# Patient Record
Sex: Male | Born: 1990 | Race: Black or African American | Hispanic: No | Marital: Single | Smoking: Current every day smoker
Health system: Southern US, Community
[De-identification: ages and names within clinical notes are randomized; demographics above are authoritative.]

## PROBLEM LIST (undated history)

## (undated) DIAGNOSIS — S82843A Displaced bimalleolar fracture of unspecified lower leg, initial encounter for closed fracture: Secondary | ICD-10-CM

## (undated) DIAGNOSIS — Z789 Other specified health status: Secondary | ICD-10-CM

## (undated) HISTORY — PX: NO PAST SURGERIES: SHX2092

---
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
## 2015-05-31 ENCOUNTER — Encounter (HOSPITAL_COMMUNITY): Payer: Self-pay | Admitting: *Deleted

## 2015-05-31 ENCOUNTER — Emergency Department (HOSPITAL_COMMUNITY)
Admission: EM | Admit: 2015-05-31 | Discharge: 2015-05-31 | Disposition: A | Discharge status: Home / Self Care | Payer: BLUE CROSS/BLUE SHIELD | Attending: Emergency Medicine | Admitting: Emergency Medicine

## 2015-05-31 DIAGNOSIS — F17210 Nicotine dependence, cigarettes, uncomplicated: Secondary | ICD-10-CM | POA: Diagnosis not present

## 2015-05-31 DIAGNOSIS — J3489 Other specified disorders of nose and nasal sinuses: Secondary | ICD-10-CM | POA: Diagnosis present

## 2015-05-31 DIAGNOSIS — J020 Streptococcal pharyngitis: Secondary | ICD-10-CM | POA: Diagnosis not present

## 2015-05-31 DIAGNOSIS — R319 Hematuria, unspecified: Secondary | ICD-10-CM

## 2015-05-31 LAB — URINALYSIS, ROUTINE W REFLEX MICROSCOPIC
GLUCOSE, UA: NEGATIVE mg/dL
NITRITE: NEGATIVE
PH: 6 (ref 5.0–8.0)
Specific Gravity, Urine: 1.02 (ref 1.005–1.030)

## 2015-05-31 LAB — URINE MICROSCOPIC-ADD ON: Squamous Epithelial / LPF: NONE SEEN

## 2015-05-31 LAB — RAPID STREP SCREEN (MED CTR MEBANE ONLY): Streptococcus, Group A Screen (Direct): POSITIVE — AB

## 2015-05-31 MED ORDER — PENICILLIN G BENZATHINE 1200000 UNIT/2ML IM SUSP
1.2000 10*6.[IU] | Freq: Once | INTRAMUSCULAR | Status: AC
  Administered 2015-05-31: 1.2 10*6.[IU] via INTRAMUSCULAR
  Filled 2015-05-31: qty 2

## 2015-05-31 MED ORDER — NAPROXEN 500 MG PO TABS: 500.0000 mg | ORAL_TABLET | Freq: Two times a day (BID) | ORAL | Status: DC

## 2015-05-31 MED ORDER — HYDROCODONE-ACETAMINOPHEN 5-325 MG PO TABS: 2.0000 | ORAL_TABLET | ORAL | Status: DC | PRN

## 2015-05-31 NOTE — ED Notes (Signed)
Patient c/o hematuria that started this morning. Denies pain or any other symptoms.

## 2015-05-31 NOTE — Discharge Instructions (Signed)
Return here in 3 days for repeat urinalysis and reevaluation. Return sooner if feeling ill in any way. Increase fluids. Gargle with salt water. Pain medication.

## 2015-05-31 NOTE — ED Notes (Signed)
Pt verbalized understanding of no driving and to use caution within 4 hours of taking pain meds due to meds cause drowsiness 

## 2015-05-31 NOTE — ED Provider Notes (Signed)
CSN: [id number]     Arrival date & time 05/31/15  1043 History  By signing my name below, I, Gwenyth Ober, attest that this documentation has been prepared under the direction and in the presence of Donnetta Hutching, MD.  Electronically Signed: Gwenyth Ober, ED Scribe. 05/31/2015. 11:44 AM.  Chief Complaint  Patient presents with  . Hematuria   The history is provided by the patient. No language interpreter was used.    HPI Comments: Danny Novak is a 23 y.o. male who presents to the Emergency Department complaining of intermittent, moderate hematuria that started this morning. He states sore throat that started 2-3 days ago and HA as associated symptoms. Pt has not tried any treatment PTA. He denies recent trauma to his abdomen. Pt also denies dysuria, penile discharge, abdominal pain and fever.  No past medical history on file. No past surgical history on file. No family history on file. Social History  Substance Use Topics  . Smoking status: Current Some Day Smoker -- 0.50 packs/day    Types: Cigarettes  . Smokeless tobacco: Not on file  . Alcohol Use: No     Comment: alcohol daily    Review of Systems  Constitutional: Negative for fever.  HENT: Positive for rhinorrhea.   Gastrointestinal: Negative for abdominal pain.  Genitourinary: Positive for hematuria. Negative for dysuria.  Neurological: Positive for headaches.  All other systems reviewed and are negative.     Allergies  Review of patient's allergies indicates no known allergies.  Home Medications   Prior to Admission medications   Medication Sig Start Date End Date Taking? Authorizing Provider  HYDROcodone-acetaminophen (NORCO/VICODIN) 5-325 MG tablet Take 2 tablets by mouth every 4 (four) hours as needed. 05/31/15   Donnetta Hutching, MD   BP 124/73 mmHg  Pulse 84  Temp(Src) 98.2 F (36.8 C) (Oral)  Resp 18  Ht  (1.803 m)  Wt 150 lb (68.04 kg)  BMI 20.93 kg/m2  SpO2 99% Physical Exam   Constitutional: He appears well-developed and well-nourished. No distress.  HENT:  Head: Normocephalic and atraumatic.  Throat mildly erythematous  Eyes: Conjunctivae and EOM are normal.  Neck: Neck supple. No tracheal deviation present.  Cardiovascular: Normal rate, regular rhythm and normal heart sounds.   Pulmonary/Chest: Effort normal and breath sounds normal. No respiratory distress. He has no wheezes. He has no rales.  Abdominal: Soft. There is no tenderness. There is no CVA tenderness.  Skin: Skin is warm and dry.  Psychiatric: He has a normal mood and affect. His behavior is normal.  Nursing note and vitals reviewed.   ED Course  Procedures  DIAGNOSTIC STUDIES: Oxygen Saturation is 100% on RA, normal by my interpretation.    COORDINATION OF CARE: 11:47 AM Discussed treatment plan with pt which includes strep test and UA. Pt agreed to plan.  Labs Review Labs Reviewed  RAPID STREP SCREEN (NOT AT [facility]) - Abnormal; Notable for the following:    Streptococcus, Group A Screen (Direct) POSITIVE (*)    All other components within normal limits  URINALYSIS, ROUTINE W REFLEX MICROSCOPIC (NOT AT [facility]) - Abnormal; Notable for the following:    APPearance HAZY (*)    Hgb urine dipstick LARGE (*)    Bilirubin Urine SMALL (*)    Ketones, ur TRACE (*)    Protein, ur TRACE (*)    Leukocytes, UA TRACE (*)    All other components within normal limits  URINE MICROSCOPIC-ADD ON - Abnormal; Notable for the following:  Bacteria, UA FEW (*)    All other components within normal limits    Imaging Review No results found. I have personally reviewed and evaluated these lab results as part of my medical decision-making.   EKG Interpretation None      MDM   Final diagnoses:  Strep pharyngitis  Hematuria    Patient is nontoxic appearing. Blood pressure is normal. 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 Will recheck in 3 days. Bicillin L-A 1.2 million units  IM in ED   I personally performed the services described in this documentation, which was scribed in my presence. The recorded information has been reviewed and is accurate.     Donnetta HutchingBrian Adrean Heitz, MD 05/31/15 [phone]

## 2015-10-03 ENCOUNTER — Encounter (HOSPITAL_COMMUNITY): Payer: Self-pay | Admitting: Emergency Medicine

## 2015-10-03 ENCOUNTER — Emergency Department (HOSPITAL_COMMUNITY)
Admission: EM | Admit: 2015-10-03 | Discharge: 2015-10-03 | Disposition: A | Discharge status: Home / Self Care | Payer: BLUE CROSS/BLUE SHIELD | Attending: Emergency Medicine | Admitting: Emergency Medicine

## 2015-10-03 DIAGNOSIS — F17210 Nicotine dependence, cigarettes, uncomplicated: Secondary | ICD-10-CM | POA: Diagnosis not present

## 2015-10-03 DIAGNOSIS — R319 Hematuria, unspecified: Secondary | ICD-10-CM | POA: Insufficient documentation

## 2015-10-03 LAB — COMPREHENSIVE METABOLIC PANEL
ALK PHOS: 56 U/L (ref 38–126)
ALT: 10 U/L — AB (ref 17–63)
ANION GAP: 8 (ref 5–15)
AST: 13 U/L — ABNORMAL LOW (ref 15–41)
Albumin: 4.3 g/dL (ref 3.5–5.0)
BUN: 14 mg/dL (ref 6–20)
CHLORIDE: 101 mmol/L (ref 101–111)
CO2: 28 mmol/L (ref 22–32)
Calcium: 9 mg/dL (ref 8.9–10.3)
Creatinine, Ser: 0.86 mg/dL (ref 0.61–1.24)
Glucose, Bld: 83 mg/dL (ref 65–99)
Potassium: 3.8 mmol/L (ref 3.5–5.1)
SODIUM: 137 mmol/L (ref 135–145)
TOTAL PROTEIN: 7.7 g/dL (ref 6.5–8.1)
Total Bilirubin: 1.3 mg/dL — ABNORMAL HIGH (ref 0.3–1.2)

## 2015-10-03 LAB — URINALYSIS, ROUTINE W REFLEX MICROSCOPIC
BILIRUBIN URINE: NEGATIVE
GLUCOSE, UA: NEGATIVE mg/dL
HGB URINE DIPSTICK: NEGATIVE
Ketones, ur: 40 mg/dL — AB
Leukocytes, UA: NEGATIVE
Nitrite: NEGATIVE
PH: 6 (ref 5.0–8.0)
Protein, ur: NEGATIVE mg/dL
SPECIFIC GRAVITY, URINE: 1.025 (ref 1.005–1.030)

## 2015-10-03 LAB — CBC WITH DIFFERENTIAL/PLATELET
Basophils Absolute: 0 10*3/uL (ref 0.0–0.1)
Basophils Relative: 1 %
EOS ABS: 0.1 10*3/uL (ref 0.0–0.7)
EOS PCT: 1 %
HCT: 47.7 % (ref 39.0–52.0)
Hemoglobin: 15.9 g/dL (ref 13.0–17.0)
LYMPHS ABS: 2 10*3/uL (ref 0.7–4.0)
LYMPHS PCT: 43 %
MCH: 29.1 pg (ref 26.0–34.0)
MCHC: 33.3 g/dL (ref 30.0–36.0)
MCV: 87.4 fL (ref 78.0–100.0)
MONOS PCT: 8 %
Monocytes Absolute: 0.4 10*3/uL (ref 0.1–1.0)
Neutro Abs: 2.2 10*3/uL (ref 1.7–7.7)
Neutrophils Relative %: 47 %
PLATELETS: 225 10*3/uL (ref 150–400)
RBC: 5.46 MIL/uL (ref 4.22–5.81)
RDW: 13.3 % (ref 11.5–15.5)
WBC: 4.7 10*3/uL (ref 4.0–10.5)

## 2015-10-03 NOTE — ED Notes (Signed)
Lab in to draw for cbc and cmp.

## 2015-10-03 NOTE — ED Notes (Addendum)
Pt reports hematuria. Pt reports urinary tract infection a few weeks ago. Pt reports never filled prescription for abx. Pt reports hematuria persists. nad noted. Pt denies any other gi/gu symptoms.

## 2015-10-03 NOTE — Discharge Instructions (Signed)
Your liver test reveals a mild change in the liver function testing. Please stop drinking alcohol. Your prostate is a little enlarged. Concerned about the blood seen in the urine. Please see MD at [facility] soon. No urine infection found at this time. Hematuria, Adult Hematuria is blood in your urine. It can be caused by a bladder infection, kidney infection, prostate infection, kidney stone, or cancer of your urinary tract. Infections can usually be treated with medicine, and a kidney stone usually will pass through your urine. If neither of these is the cause of your hematuria, further workup to find out the reason may be needed. It is very important that you tell your health care provider about any blood you see in your urine, even if the blood stops without treatment or happens without causing pain. Blood in your urine that happens and then stops and then happens again can be a symptom of a very serious condition. Also, pain is not a symptom in the initial stages of many urinary cancers. HOME CARE INSTRUCTIONS   Drink lots of fluid, 3-4 quarts a day. If you have been diagnosed with an infection, cranberry juice is especially recommended, in addition to large amounts of water.  Avoid caffeine, tea, and carbonated beverages because they tend to irritate the bladder.  Avoid alcohol because it may irritate the prostate.  Take all medicines as directed by your health care provider.  If you were prescribed an antibiotic medicine, finish it all even if you start to feel better.  If you have been diagnosed with a kidney stone, follow your health care provider's instructions regarding straining your urine to catch the stone.  Empty your bladder often. Avoid holding urine for long periods of time.  After a bowel movement, women should cleanse front to back. Use each tissue only once.  Empty your bladder before and after sexual intercourse if you are a male. SEEK MEDICAL CARE IF:  You  develop back pain.  You have a fever.  You have a feeling of sickness in your stomach (nausea) or vomiting.  Your symptoms are not better in 3 days. Return sooner if you are getting worse. SEEK IMMEDIATE MEDICAL CARE IF:   You develop severe vomiting and are unable to keep the medicine down.  You develop severe back or abdominal pain despite taking your medicines.  You begin passing a large amount of blood or clots in your urine.  You feel extremely weak or faint, or you pass out. MAKE SURE YOU:   Understand these instructions.  Will watch your condition.  Will get help right away if you are not doing well or get worse.   This information is not intended to replace advice given to you by your health care provider. Make sure you discuss any questions you have with your health care provider.   Document Released: 06/17/2005 Document Revised: 07/08/2014 Document Reviewed: 02/15/2013 Elsevier Interactive Patient Education [employer]

## 2015-10-03 NOTE — ED Provider Notes (Signed)
CSN: [id number]     Arrival date & time 10/03/15  1455 History   First MD Initiated Contact with Patient 10/03/15 1603     Chief Complaint  Patient presents with  . Hematuria     (Consider location/radiation/quality/duration/timing/severity/associated sxs/prior Treatment) HPI Comments: Pt is a 25 y/o male. Who presents to the ED with hematuria. He was diagnosed with UTI and hematuria. He was prescribed antibiotics, but did not get the Rx filled. He now reports hematuria again. No n/v. No reported fever.  Patient is a 25 y.o. male presenting with hematuria. The history is provided by the patient.  Hematuria This is a recurrent problem. The current episode started 1 to 4 weeks ago. The problem occurs intermittently. The problem has been unchanged. Pertinent negatives include no abdominal pain, chills, fever, nausea, vomiting or weakness. Nothing aggravates the symptoms. He has tried nothing for the symptoms.    History reviewed. No pertinent past medical history. History reviewed. No pertinent past surgical history. History reviewed. No pertinent family history. Social History  Substance Use Topics  . Smoking status: Current Some Day Smoker -- 0.50 packs/day    Types: Cigarettes  . Smokeless tobacco: None  . Alcohol Use: Yes     Comment: alcohol daily    Review of Systems  Constitutional: Negative for fever and chills.  Gastrointestinal: Negative for nausea, vomiting and abdominal pain.  Genitourinary: Positive for hematuria. Negative for dysuria, frequency, flank pain and penile pain.  Neurological: Negative for weakness.  All other systems reviewed and are negative.     Allergies  Review of patient's allergies indicates no known allergies.  Home Medications   Prior to Admission medications   Medication Sig Start Date End Date Taking? Authorizing Provider  HYDROcodone-acetaminophen (NORCO/VICODIN) 5-325 MG tablet Take 2 tablets by mouth every 4 (four) hours as needed.  05/31/15   Donnetta Hutching, MD   BP 135/76 mmHg  Pulse 81  Temp(Src) 98 F (36.7 C) (Oral)  Resp 14  Ht  (1.803 m)  Wt 70.308 kg  BMI 21.63 kg/m2  SpO2 99% Physical Exam  Constitutional: He is oriented to person, place, and time. He appears well-developed and well-nourished.  Non-toxic appearance.  HENT:  Head: Normocephalic.  Right Ear: Tympanic membrane and external ear normal.  Left Ear: Tympanic membrane and external ear normal.  Eyes: EOM and lids are normal. Pupils are equal, round, and reactive to light.  Neck: Normal range of motion. Neck supple. Carotid bruit is not present.  Cardiovascular: Normal rate, regular rhythm, normal heart sounds, intact distal pulses and normal pulses.   Pulmonary/Chest: Breath sounds normal. No respiratory distress.  Abdominal: Soft. Bowel sounds are normal. There is no tenderness. There is no guarding.  Genitourinary: Rectum normal and penis normal. Prostate is enlarged. No penile tenderness. No discharge found.  Prostate mildly enlarged. Minimally tender.   Musculoskeletal: Normal range of motion.  Lymphadenopathy:       Head (right side): No submandibular adenopathy present.       Head (left side): No submandibular adenopathy present.    He has no cervical adenopathy.  Neurological: He is alert and oriented to person, place, and time. He has normal strength. No cranial nerve deficit or sensory deficit.  Skin: Skin is warm and dry.  Psychiatric: He has a normal mood and affect. His speech is normal.  Nursing note and vitals reviewed.   ED Course  Procedures (including critical care time) Labs Review Labs Reviewed  URINALYSIS, ROUTINE W REFLEX  MICROSCOPIC (NOT AT [facility]) - Abnormal; Notable for the following:    Ketones, ur 40 (*)    All other components within normal limits    Imaging Review No results found. I have personally reviewed and evaluated these images and lab results as part of my medical decision-making.   EKG  Interpretation None      MDM  The urine is neg for blood. Doubt kidney stone,(no pain). Will observe for developing prostatitis. No evidence for UTI. No evidence of shock or advancing infection. Slight changes  Noted in the liver function with Total Bili 1.3 elevated.  CBC wnl. Prostate mildly enlarged. Pt referred to [facility]. Pt in agreement with d/c plan.   Final diagnoses:  None    **I have reviewed nursing notes, vital signs, and all appropriate lab and imaging results for this patient.Ivery Quale*    Chadwin Fury, PA-C 10/03/15 1758  Linwood DibblesJon Knapp, MD 10/05/15 [phone]

## 2015-10-04 LAB — URINE CULTURE: Culture: NO GROWTH

## 2018-03-18 ENCOUNTER — Other Ambulatory Visit: Payer: Self-pay | Admitting: Advanced Practice Midwife

## 2018-03-18 MED ORDER — AZITHROMYCIN 500 MG PO TABS: 1000.0000 mg | ORAL_TABLET | Freq: Once | ORAL | 0 refills | Status: AC

## 2018-03-18 NOTE — Progress Notes (Signed)
Azithromycin 2 gm d/t contact w/jacquelynn weilfrazier

## 2018-03-29 ENCOUNTER — Other Ambulatory Visit: Payer: Self-pay | Admitting: Advanced Practice Midwife

## 2018-03-29 MED ORDER — METRONIDAZOLE 500 MG PO TABS: 2000.0000 mg | ORAL_TABLET | Freq: Once | ORAL | 0 refills | Status: AC

## 2018-03-29 NOTE — Progress Notes (Signed)
Flagyl 2mg  X 1 for + trichomonas of girlfriend, [employer]

## 2021-03-16 ENCOUNTER — Ambulatory Visit: Payer: Self-pay

## 2021-03-16 NOTE — Telephone Encounter (Signed)
Patient called and says he went somewhere to get tested for STD and was told he has chlamydia, but they do not prescribe treatment. He asks where can he go to get treatment. I advised he could go to the health department, go to the UC or ED and let them know he wants STD testing, then he would receive treatment if needed. Patient verbalized understanding.  Summary: STD questions advice   [phone] Pt is seeking advice for STD testing      Reason for Disposition  Health Information question, no triage required and triager able to answer question  Answer Assessment - Initial Assessment Questions 1. REASON FOR CALL or QUESTION: "What is your reason for calling today?" or "How can I best help you?" or "What question do you have that I can help answer?"     I need treatment for chlamydia  Protocols used: Information Only Call - No Triage-A-AH

## 2023-01-14 ENCOUNTER — Encounter (HOSPITAL_BASED_OUTPATIENT_CLINIC_OR_DEPARTMENT_OTHER): Payer: Self-pay | Admitting: Orthopedic Surgery

## 2023-01-14 ENCOUNTER — Other Ambulatory Visit (HOSPITAL_COMMUNITY): Payer: Self-pay | Admitting: Orthopedic Surgery

## 2023-01-14 ENCOUNTER — Other Ambulatory Visit: Payer: Self-pay

## 2023-01-16 ENCOUNTER — Other Ambulatory Visit: Payer: Self-pay

## 2023-01-16 ENCOUNTER — Ambulatory Visit (HOSPITAL_BASED_OUTPATIENT_CLINIC_OR_DEPARTMENT_OTHER): Payer: BC Managed Care – PPO

## 2023-01-16 ENCOUNTER — Encounter (HOSPITAL_BASED_OUTPATIENT_CLINIC_OR_DEPARTMENT_OTHER)
Admission: RE | Disposition: A | Discharge status: Home / Self Care | Payer: Self-pay | Source: Home / Self Care | Attending: Orthopedic Surgery

## 2023-01-16 ENCOUNTER — Ambulatory Visit (HOSPITAL_BASED_OUTPATIENT_CLINIC_OR_DEPARTMENT_OTHER): Payer: BC Managed Care – PPO | Admitting: Anesthesiology

## 2023-01-16 ENCOUNTER — Ambulatory Visit (HOSPITAL_BASED_OUTPATIENT_CLINIC_OR_DEPARTMENT_OTHER)
Admission: RE | Admit: 2023-01-16 | Discharge: 2023-01-16 | Disposition: A | Discharge status: Home / Self Care | Payer: BC Managed Care – PPO | Attending: Orthopedic Surgery | Admitting: Orthopedic Surgery

## 2023-01-16 ENCOUNTER — Encounter (HOSPITAL_BASED_OUTPATIENT_CLINIC_OR_DEPARTMENT_OTHER): Payer: Self-pay | Admitting: Orthopedic Surgery

## 2023-01-16 DIAGNOSIS — F17210 Nicotine dependence, cigarettes, uncomplicated: Secondary | ICD-10-CM | POA: Insufficient documentation

## 2023-01-16 DIAGNOSIS — S82841A Displaced bimalleolar fracture of right lower leg, initial encounter for closed fracture: Secondary | ICD-10-CM | POA: Diagnosis present

## 2023-01-16 DIAGNOSIS — X58XXXA Exposure to other specified factors, initial encounter: Secondary | ICD-10-CM | POA: Diagnosis not present

## 2023-01-16 DIAGNOSIS — Y9372 Activity, wrestling: Secondary | ICD-10-CM | POA: Diagnosis not present

## 2023-01-16 HISTORY — PX: ORIF ANKLE FRACTURE: SHX5408

## 2023-01-16 HISTORY — PX: SYNDESMOSIS REPAIR: SHX5182

## 2023-01-16 HISTORY — DX: Displaced bimalleolar fracture of unspecified lower leg, initial encounter for closed fracture: S82.843A

## 2023-01-16 HISTORY — DX: Other specified health status: Z78.9

## 2023-01-16 SURGERY — OPEN REDUCTION INTERNAL FIXATION (ORIF) ANKLE FRACTURE
Anesthesia: General | Site: Ankle | Laterality: Right

## 2023-01-16 MED ORDER — VANCOMYCIN HCL 500 MG IV SOLR
INTRAVENOUS | Status: DC | PRN
  Administered 2023-01-16: 500 mg via TOPICAL

## 2023-01-16 MED ORDER — SODIUM CHLORIDE 0.9 % IV SOLN: INTRAVENOUS | Status: DC

## 2023-01-16 MED ORDER — LIDOCAINE HCL (CARDIAC) PF 100 MG/5ML IV SOSY
PREFILLED_SYRINGE | INTRAVENOUS | Status: DC | PRN
  Administered 2023-01-16: 40 mg via INTRATRACHEAL

## 2023-01-16 MED ORDER — ROPIVACAINE HCL 5 MG/ML IJ SOLN
INTRAMUSCULAR | Status: DC | PRN
  Administered 2023-01-16: 50 mL via PERINEURAL

## 2023-01-16 MED ORDER — EPHEDRINE SULFATE (PRESSORS) 50 MG/ML IJ SOLN
INTRAMUSCULAR | Status: DC | PRN
  Administered 2023-01-16: 5 mg via INTRAVENOUS

## 2023-01-16 MED ORDER — ONDANSETRON HCL 4 MG/2ML IJ SOLN
INTRAMUSCULAR | Status: AC
  Filled 2023-01-16: qty 2

## 2023-01-16 MED ORDER — 0.9 % SODIUM CHLORIDE (POUR BTL) OPTIME
TOPICAL | Status: DC | PRN
  Administered 2023-01-16: 200 mL

## 2023-01-16 MED ORDER — FENTANYL CITRATE (PF) 100 MCG/2ML IJ SOLN
INTRAMUSCULAR | Status: AC
  Filled 2023-01-16: qty 2

## 2023-01-16 MED ORDER — PROMETHAZINE HCL 25 MG/ML IJ SOLN: 6.2500 mg | INTRAMUSCULAR | Status: DC | PRN

## 2023-01-16 MED ORDER — MIDAZOLAM HCL 2 MG/2ML IJ SOLN
2.0000 mg | Freq: Once | INTRAMUSCULAR | Status: AC
  Administered 2023-01-16: 2 mg via INTRAVENOUS

## 2023-01-16 MED ORDER — RIVAROXABAN 10 MG PO TABS: 10.0000 mg | ORAL_TABLET | Freq: Every day | ORAL | 0 refills | Status: AC

## 2023-01-16 MED ORDER — MEPERIDINE HCL 25 MG/ML IJ SOLN: 6.2500 mg | INTRAMUSCULAR | Status: DC | PRN

## 2023-01-16 MED ORDER — CEFAZOLIN SODIUM-DEXTROSE 2-4 GM/100ML-% IV SOLN
2.0000 g | INTRAVENOUS | Status: AC
  Administered 2023-01-16: 2 g via INTRAVENOUS

## 2023-01-16 MED ORDER — EPHEDRINE 5 MG/ML INJ
INTRAVENOUS | Status: AC
  Filled 2023-01-16: qty 5

## 2023-01-16 MED ORDER — LACTATED RINGERS IV SOLN: INTRAVENOUS | Status: DC

## 2023-01-16 MED ORDER — ONDANSETRON HCL 4 MG/2ML IJ SOLN
INTRAMUSCULAR | Status: DC | PRN
  Administered 2023-01-16: 4 mg via INTRAVENOUS

## 2023-01-16 MED ORDER — DEXAMETHASONE SODIUM PHOSPHATE 10 MG/ML IJ SOLN
INTRAMUSCULAR | Status: AC
  Filled 2023-01-16: qty 1

## 2023-01-16 MED ORDER — LIDOCAINE 2% (20 MG/ML) 5 ML SYRINGE
INTRAMUSCULAR | Status: AC
  Filled 2023-01-16: qty 5

## 2023-01-16 MED ORDER — PROPOFOL 10 MG/ML IV BOLUS
INTRAVENOUS | Status: AC
  Filled 2023-01-16: qty 20

## 2023-01-16 MED ORDER — HYDROMORPHONE HCL 1 MG/ML IJ SOLN: 0.2500 mg | INTRAMUSCULAR | Status: DC | PRN

## 2023-01-16 MED ORDER — PHENYLEPHRINE 80 MCG/ML (10ML) SYRINGE FOR IV PUSH (FOR BLOOD PRESSURE SUPPORT)
PREFILLED_SYRINGE | INTRAVENOUS | Status: AC
  Filled 2023-01-16: qty 10

## 2023-01-16 MED ORDER — MIDAZOLAM HCL 2 MG/2ML IJ SOLN
INTRAMUSCULAR | Status: AC
  Filled 2023-01-16: qty 2

## 2023-01-16 MED ORDER — AMISULPRIDE (ANTIEMETIC) 5 MG/2ML IV SOLN: 10.0000 mg | Freq: Once | INTRAVENOUS | Status: DC | PRN

## 2023-01-16 MED ORDER — OXYCODONE HCL 5 MG PO TABS: 5.0000 mg | ORAL_TABLET | Freq: Four times a day (QID) | ORAL | 0 refills | Status: AC | PRN

## 2023-01-16 MED ORDER — CEFAZOLIN SODIUM-DEXTROSE 2-4 GM/100ML-% IV SOLN
INTRAVENOUS | Status: AC
  Filled 2023-01-16: qty 100

## 2023-01-16 MED ORDER — OXYCODONE HCL 5 MG PO TABS: 5.0000 mg | ORAL_TABLET | Freq: Once | ORAL | Status: DC | PRN

## 2023-01-16 MED ORDER — FENTANYL CITRATE (PF) 100 MCG/2ML IJ SOLN
100.0000 ug | Freq: Once | INTRAMUSCULAR | Status: AC
  Administered 2023-01-16: 100 ug via INTRAVENOUS

## 2023-01-16 MED ORDER — OXYCODONE HCL 5 MG/5ML PO SOLN: 5.0000 mg | Freq: Once | ORAL | Status: DC | PRN

## 2023-01-16 MED ORDER — DEXAMETHASONE SODIUM PHOSPHATE 10 MG/ML IJ SOLN
INTRAMUSCULAR | Status: DC | PRN
  Administered 2023-01-16: 5 mg via INTRAVENOUS

## 2023-01-16 MED ORDER — PROPOFOL 10 MG/ML IV BOLUS
INTRAVENOUS | Status: DC | PRN
  Administered 2023-01-16: 200 mg via INTRAVENOUS

## 2023-01-16 MED ORDER — PHENYLEPHRINE HCL (PRESSORS) 10 MG/ML IV SOLN
INTRAVENOUS | Status: DC | PRN
  Administered 2023-01-16 (×3): 80 ug via INTRAVENOUS

## 2023-01-16 SURGICAL SUPPLY — 80 items
APL PRP STRL LF DISP 70% ISPRP (MISCELLANEOUS) ×1
BANDAGE ESMARK 6X9 LF (GAUZE/BANDAGES/DRESSINGS) IMPLANT
BIT DRILL 2.5X2.75 QC CALB (BIT) IMPLANT
BIT DRILL 2.9 CANN QC NONSTRL (BIT) IMPLANT
BLADE SURG 15 STRL LF DISP TIS (BLADE) ×2 IMPLANT
BLADE SURG 15 STRL SS (BLADE) ×3
BNDG CMPR 5X4 KNIT ELC UNQ LF (GAUZE/BANDAGES/DRESSINGS) ×1
BNDG CMPR 6 X 5 YARDS HK CLSR (GAUZE/BANDAGES/DRESSINGS) ×1
BNDG CMPR 6"X 5 YARDS HK CLSR (GAUZE/BANDAGES/DRESSINGS) ×1
BNDG CMPR 9X4 STRL LF SNTH (GAUZE/BANDAGES/DRESSINGS)
BNDG CMPR 9X6 STRL LF SNTH (GAUZE/BANDAGES/DRESSINGS)
BNDG ELASTIC 4INX 5YD STR LF (GAUZE/BANDAGES/DRESSINGS) ×1 IMPLANT
BNDG ELASTIC 6INX 5YD STR LF (GAUZE/BANDAGES/DRESSINGS) ×1 IMPLANT
BNDG ESMARK 4X9 LF (GAUZE/BANDAGES/DRESSINGS) IMPLANT
BNDG ESMARK 6X9 LF (GAUZE/BANDAGES/DRESSINGS)
CANISTER SUCT 1200ML W/VALVE (MISCELLANEOUS) ×1 IMPLANT
CHLORAPREP W/TINT 26 (MISCELLANEOUS) ×1 IMPLANT
COVER BACK TABLE 60X90IN (DRAPES) ×1 IMPLANT
CUFF TOURN SGL QUICK 34 (TOURNIQUET CUFF) ×1
CUFF TRNQT CYL 34X4.125X (TOURNIQUET CUFF) IMPLANT
DRAPE EXTREMITY T 121X128X90 (DISPOSABLE) ×1 IMPLANT
DRAPE OEC MINIVIEW 54X84 (DRAPES) ×1 IMPLANT
DRAPE U-SHAPE 47X51 STRL (DRAPES) ×1 IMPLANT
DRSG MEPITEL 4X7.2 (GAUZE/BANDAGES/DRESSINGS) ×1 IMPLANT
ELECT REM PT RETURN 9FT ADLT (ELECTROSURGICAL) ×1
ELECTRODE REM PT RTRN 9FT ADLT (ELECTROSURGICAL) ×1 IMPLANT
FIXATION ZIPTIGHT ANKLE SNDSMS (Ankle) IMPLANT
GAUZE PAD ABD 8X10 STRL (GAUZE/BANDAGES/DRESSINGS) ×2 IMPLANT
GAUZE SPONGE 4X4 12PLY STRL (GAUZE/BANDAGES/DRESSINGS) ×1 IMPLANT
GLOVE BIO SURGEON STRL SZ8 (GLOVE) ×1 IMPLANT
GLOVE BIOGEL PI IND STRL 7.0 (GLOVE) IMPLANT
GLOVE BIOGEL PI IND STRL 8 (GLOVE) ×2 IMPLANT
GLOVE ECLIPSE 8.0 STRL XLNG CF (GLOVE) ×1 IMPLANT
GLOVE SURG SS PI 7.0 STRL IVOR (GLOVE) IMPLANT
GOWN STRL REUS W/ TWL LRG LVL3 (GOWN DISPOSABLE) ×1 IMPLANT
GOWN STRL REUS W/ TWL XL LVL3 (GOWN DISPOSABLE) ×2 IMPLANT
GOWN STRL REUS W/TWL LRG LVL3 (GOWN DISPOSABLE) ×1
GOWN STRL REUS W/TWL XL LVL3 (GOWN DISPOSABLE) ×1
K-WIRE ACE 1.6X6 (WIRE) ×2
KWIRE ACE 1.6X6 (WIRE) IMPLANT
NDL HYPO 22X1.5 SAFETY MO (MISCELLANEOUS) IMPLANT
NEEDLE HYPO 22X1.5 SAFETY MO (MISCELLANEOUS)
NS IRRIG 1000ML POUR BTL (IV SOLUTION) ×1 IMPLANT
PACK BASIN DAY SURGERY FS (CUSTOM PROCEDURE TRAY) ×1 IMPLANT
PAD CAST 4YDX4 CTTN HI CHSV (CAST SUPPLIES) ×1 IMPLANT
PADDING CAST ABS COTTON 4X4 ST (CAST SUPPLIES) IMPLANT
PADDING CAST COTTON 4X4 STRL (CAST SUPPLIES) ×1
PADDING CAST COTTON 6X4 STRL (CAST SUPPLIES) ×1 IMPLANT
PENCIL SMOKE EVACUATOR (MISCELLANEOUS) ×1 IMPLANT
PLATE ACE 100DEG 3HOLE (Plate) IMPLANT
PLATE ACE 3.5MM 2HOLE (Plate) IMPLANT
SANITIZER HAND PURELL FF 515ML (MISCELLANEOUS) ×1 IMPLANT
SCREW ACE CAN 4.0 50M (Screw) IMPLANT
SCREW ACE CAN 4.0 55M (Screw) IMPLANT
SCREW CORTICAL 3.5MM 16MM (Screw) IMPLANT
SCREW CORTICAL 3.5MM 18MM (Screw) IMPLANT
SCREW CORTICAL 3.5MM 22MM (Screw) IMPLANT
SCREW CORTICAL 3.5MM 36MM (Screw) IMPLANT
SCREW CORTICAL 3.5MM 38MM (Screw) IMPLANT
SHEET MEDIUM DRAPE 40X70 STRL (DRAPES) ×1 IMPLANT
SLEEVE SCD COMPRESS KNEE MED (STOCKING) ×1 IMPLANT
SPIKE FLUID TRANSFER (MISCELLANEOUS) IMPLANT
SPLINT PLASTER CAST FAST 5X30 (CAST SUPPLIES) ×20 IMPLANT
SPONGE T-LAP 18X18 ~~LOC~~+RFID (SPONGE) ×1 IMPLANT
STOCKINETTE 6 STRL (DRAPES) ×1 IMPLANT
SUCTION TUBE FRAZIER 10FR DISP (SUCTIONS) ×1 IMPLANT
SUT ETHILON 3 0 PS 1 (SUTURE) ×1 IMPLANT
SUT FIBERWIRE #2 38 T-5 BLUE (SUTURE)
SUT MNCRL AB 3-0 PS2 18 (SUTURE) IMPLANT
SUT VIC AB 2-0 SH 27 (SUTURE) ×1
SUT VIC AB 2-0 SH 27XBRD (SUTURE) ×1 IMPLANT
SUT VICRYL 0 SH 27 (SUTURE) IMPLANT
SUTURE FIBERWR #2 38 T-5 BLUE (SUTURE) IMPLANT
SYR BULB EAR ULCER 3OZ GRN STR (SYRINGE) ×1 IMPLANT
SYR CONTROL 10ML LL (SYRINGE) IMPLANT
TOWEL GREEN STERILE FF (TOWEL DISPOSABLE) ×2 IMPLANT
TUBE CONNECTING 20X1/4 (TUBING) ×1 IMPLANT
UNDERPAD 30X36 HEAVY ABSORB (UNDERPADS AND DIAPERS) ×1 IMPLANT
WASHER PLAIN FLAT ACE NS 3PK (Orthopedic Implant) IMPLANT
ZIPTIGHT ANKLE SYNODESMOSS FIX (Ankle) ×1 IMPLANT

## 2023-01-16 NOTE — Anesthesia Postprocedure Evaluation (Signed)
Anesthesia Post Note  Patient: Danny Novak  Procedure(s) Performed: Open Reduction Internal Fixation (ORIF) Bimalleolar Fracture (Right: Ankle) Open Reduction Internal Fixation Syndesmosis Disruption (Right: Ankle)     Patient location during evaluation: PACU Anesthesia Type: General Level of consciousness: awake and alert Pain management: pain level controlled Vital Signs Assessment: post-procedure vital signs reviewed and stable Respiratory status: spontaneous breathing, nonlabored ventilation and respiratory function stable Cardiovascular status: blood pressure returned to baseline and stable Postop Assessment: no apparent nausea or vomiting Anesthetic complications: no   No notable events documented.  Last Vitals:  Vitals:   01/16/23 1645 01/16/23 1703  BP: 128/82 138/81  Pulse: 89 85  Resp: 14 13  Temp:  (!) 36.2 C  SpO2: 98% 97%    Last Pain:  Vitals:   01/16/23 1703  TempSrc: Temporal  PainSc: 0-No pain                 Lowella Curb

## 2023-01-16 NOTE — H&P (Signed)
Danny Novak is an 32 y.o. male.   Chief Complaint: Right ankle pain HPI: 32 year old male with past medical history significant for smoking complains of right ankle pain since an injury approximately 2 weeks ago.  He was wrestling with his cousin when he twisted his right ankle.  He sustained a syndesmosis disruption, proximal fibula fracture and posterior malleolus fracture.  He presents now for operative treatment of this displaced and unstable right ankle injury.  Past Medical History:  Diagnosis Date   Bimalleolar ankle fracture    Medical history non-contributory     Past Surgical History:  Procedure Laterality Date   NO PAST SURGERIES      History reviewed. No pertinent family history. Social History:  reports that he has been smoking cigarettes. He does not have any smokeless tobacco history on file. He reports current alcohol use. He reports current drug use. Drug: Marijuana.  Allergies: No Known Allergies  Medications Prior to Admission  Medication Sig Dispense Refill   oxyCODONE (OXY IR/ROXICODONE) 5 MG immediate release tablet Take 5 mg by mouth every 4 (four) hours as needed for severe pain.      No results found for this or any previous visit (from the past 48 hour(s)). No results found.  Review of Systems no recent fever, chills, nausea, vomiting or changes in his appetite  Height 5\' 11"  (1.803 m), weight 73 kg. Physical Exam  Well-nourished well-developed man in no apparent distress.  Alert and oriented.  Normal mood and affect.  Gait is nonweightbearing on the right.  The right ankle is splinted.  Brisk capillary refill of the toes.  Intact sensibility to light touch dorsally and plantarly at the forefoot.  Active range of motion and dorsiflexion plantarflexion of the toes.   Assessment/Plan Right ankle bimalleolar fracture and syndesmosis disruption -to the operating room today for open treatment of this right ankle unstable injury with internal fixation.   The risks and benefits of the alternative treatment options have been discussed in detail.  The patient wishes to proceed with surgery and specifically understands risks of bleeding, infection, nerve damage, blood clots, need for additional surgery, amputation and death.   Toni Arthurs, MD 2023-02-01, 12:49 PM

## 2023-01-16 NOTE — Anesthesia Procedure Notes (Signed)
Anesthesia Regional Block: Adductor canal block   Pre-Anesthetic Checklist: , timeout performed,  Correct Patient, Correct Site, Correct Laterality,  Correct Procedure, Correct Position, site marked,  Risks and benefits discussed,  Surgical consent,  Pre-op evaluation,  At surgeon's request and post-op pain management  Laterality: Right  Prep: chloraprep       Needles:  Injection technique: Single-shot  Needle Type: Stimiplex     Needle Length: 9cm  Needle Gauge: 21     Additional Needles:   Procedures:,,,, ultrasound used (permanent image in chart),,    Narrative:  Start time: 01/16/2023 1:53 PM End time: 01/16/2023 1:58 PM Injection made incrementally with aspirations every 5 mL.  Performed by: Personally  Anesthesiologist: Lowella Curb, MD

## 2023-01-16 NOTE — Anesthesia Procedure Notes (Signed)
Anesthesia Regional Block: Popliteal block   Pre-Anesthetic Checklist: , timeout performed,  Correct Patient, Correct Site, Correct Laterality,  Correct Procedure, Correct Position, site marked,  Risks and benefits discussed,  Surgical consent,  Pre-op evaluation,  At surgeon's request and post-op pain management  Laterality: Right  Prep: chloraprep       Needles:  Injection technique: Single-shot  Needle Type: Stimiplex     Needle Length: 9cm  Needle Gauge: 21     Additional Needles:   Procedures:,,,, ultrasound used (permanent image in chart),,    Narrative:  Start time: 01/16/2023 1:54 PM End time: 01/16/2023 1:59 PM Injection made incrementally with aspirations every 5 mL.  Performed by: Personally  Anesthesiologist: Lowella Curb, MD

## 2023-01-16 NOTE — Anesthesia Preprocedure Evaluation (Addendum)
Anesthesia Evaluation  Patient identified by MRN, date of birth, ID band Patient awake    Reviewed: Allergy & Precautions, H&P , NPO status , Patient's Chart, lab work & pertinent test results  Airway Mallampati: II  TM Distance: >3 FB Neck ROM: Full    Dental no notable dental hx.    Pulmonary Current Smoker and Patient abstained from smoking.   Pulmonary exam normal breath sounds clear to auscultation       Cardiovascular negative cardio ROS Normal cardiovascular exam Rhythm:Regular Rate:Normal     Neuro/Psych negative neurological ROS  negative psych ROS   GI/Hepatic negative GI ROS, Neg liver ROS,,,  Endo/Other  negative endocrine ROS    Renal/GU negative Renal ROS  negative genitourinary   Musculoskeletal negative musculoskeletal ROS (+)    Abdominal   Peds negative pediatric ROS (+)  Hematology negative hematology ROS (+)   Anesthesia Other Findings   Reproductive/Obstetrics negative OB ROS                              Anesthesia Physical Anesthesia Plan  ASA: 2  Anesthesia Plan: General   Post-op Pain Management: Regional block* and Minimal or no pain anticipated   Induction: Intravenous  PONV Risk Score and Plan: 1 and Ondansetron and Treatment may vary due to age or medical condition  Airway Management Planned: LMA  Additional Equipment:   Intra-op Plan:   Post-operative Plan: Extubation in OR  Informed Consent: I have reviewed the patients History and Physical, chart, labs and discussed the procedure including the risks, benefits and alternatives for the proposed anesthesia with the patient or authorized representative who has indicated his/her understanding and acceptance.     Dental advisory given  Plan Discussed with: CRNA  Anesthesia Plan Comments:         Anesthesia Quick Evaluation

## 2023-01-16 NOTE — Anesthesia Procedure Notes (Signed)
Procedure Name: LMA Insertion Date/Time: 01/16/2023 2:49 PM  Performed by: Thornell Mule, CRNAPre-anesthesia Checklist: Patient identified, Emergency Drugs available, Suction available and Patient being monitored Patient Re-evaluated:Patient Re-evaluated prior to induction Oxygen Delivery Method: Circle system utilized Preoxygenation: Pre-oxygenation with 100% oxygen Induction Type: IV induction LMA: LMA inserted LMA Size: 4.0 Number of attempts: 1 Placement Confirmation: positive ETCO2 Tube secured with: Tape Dental Injury: Teeth and Oropharynx as per pre-operative assessment

## 2023-01-16 NOTE — Progress Notes (Signed)
Assisted Dr. Sabra Heck with right, adductor canal, popliteal, ultrasound guided block. Side rails up, monitors on throughout procedure. See vital signs in flow sheet. Tolerated Procedure well.

## 2023-01-16 NOTE — Discharge Instructions (Addendum)
Toni Arthurs, MD [facility]  Please read the following information regarding your care after surgery.  Medications  You only need a prescription for the narcotic pain medicine (ex. oxycodone, Percocet, Norco).  All of the other medicines listed below are available over the counter. X Aleve 2 pills twice a day for the first 3 days after surgery. X acetominophen (Tylenol) 650 mg every 4-6 hours as you need for minor to moderate pain X oxycodone as prescribed for severe pain  Narcotic pain medicine (ex. oxycodone, Percocet, Vicodin) will cause constipation.  To prevent this problem, take the following medicines while you are taking any pain medicine. X docusate sodium (Colace) 100 mg twice a day X senna (Senokot) 2 tablets twice a day  X To help prevent blood clots, take Xarelto twice a day for two weeks after surgery.  You should also get up every hour while you are awake to move around.    Weight Bearing X Do not bear any weight on the operated leg or foot.  Cast / Splint / Dressing X Keep your splint, cast or dressing clean and dry.  Don't put anything (coat hanger, pencil, etc) down inside of it.  If it gets damp, use a hair dryer on the cool setting to dry it.  If it gets soaked, call the office to schedule an appointment for a cast change.  After your dressing, cast or splint is removed; you may shower, but do not soak or scrub the wound.  Allow the water to run over it, and then gently pat it dry.  Swelling It is normal for you to have swelling where you had surgery.  To reduce swelling and pain, keep your toes above your nose for at least 3 days after surgery.  It may be necessary to keep your foot or leg elevated for several weeks.  If it hurts, it should be elevated.  Follow Up Call my office at [phone] when you are discharged from the hospital or surgery center to schedule an appointment to be seen two weeks after surgery.  Call my office at [phone] if you develop a  fever >101.5 F, nausea, vomiting, bleeding from the surgical site or severe pain.      Post Anesthesia Home Care Instructions  Activity: Get plenty of rest for the remainder of the day. A responsible individual must stay with you for 24 hours following the procedure.  For the next 24 hours, DO NOT: -Drive a car -Advertising copywriter -Drink alcoholic beverages -Take any medication unless instructed by your physician -Make any legal decisions or sign important papers.  Meals: Start with liquid foods such as gelatin or soup. Progress to regular foods as tolerated. Avoid greasy, spicy, heavy foods. If nausea and/or vomiting occur, drink only clear liquids until the nausea and/or vomiting subsides. Call your physician if vomiting continues.  Special Instructions/Symptoms: Your throat may feel dry or sore from the anesthesia or the breathing tube placed in your throat during surgery. If this causes discomfort, gargle with warm salt water. The discomfort should disappear within 24 hours.  If you had a scopolamine patch placed behind your ear for the management of post- operative nausea and/or vomiting:  1. The medication in the patch is effective for 72 hours, after which it should be removed.  Wrap patch in a tissue and discard in the trash. Wash hands thoroughly with soap and water. 2. You may remove the patch earlier than 72 hours if you experience unpleasant side effects which  may include dry mouth, dizziness or visual disturbances. 3. Avoid touching the patch. Wash your hands with soap and water after contact with the patch.    Regional Anesthesia Blocks  1. Numbness or the inability to move the "blocked" extremity may last from 3-48 hours after placement. The length of time depends on the medication injected and your individual response to the medication. If the numbness is not going away after 48 hours, call your surgeon.  2. The extremity that is blocked will need to be protected until  the numbness is gone and the  Strength has returned. Because you cannot feel it, you will need to take extra care to avoid injury. Because it may be weak, you may have difficulty moving it or using it. You may not know what position it is in without looking at it while the block is in effect.  3. For blocks in the legs and feet, returning to weight bearing and walking needs to be done carefully. You will need to wait until the numbness is entirely gone and the strength has returned. You should be able to move your leg and foot normally before you try and bear weight or walk. You will need someone to be with you when you first try to ensure you do not fall and possibly risk injury.  4. Bruising and tenderness at the needle site are common side effects and will resolve in a few days.  5. Persistent numbness or new problems with movement should be communicated to the surgeon or the [facility] [phone] [facility] [phone]).

## 2023-01-16 NOTE — Transfer of Care (Signed)
Immediate Anesthesia Transfer of Care Note  Patient: Danny Novak  Procedure(s) Performed: Open Reduction Internal Fixation (ORIF) Bimalleolar Fracture (Right: Ankle) Open Reduction Internal Fixation Syndesmosis Disruption (Right: Ankle)  Patient Location: PACU  Anesthesia Type:GA combined with regional for post-op pain  Level of Consciousness: drowsy  Airway & Oxygen Therapy: Patient Spontanous Breathing and Patient connected to face mask oxygen  Post-op Assessment: Report given to RN and Post -op Vital signs reviewed and stable  Post vital signs: Reviewed and stable  Last Vitals:  Vitals Value Taken Time  BP 136/91 (102) 01/16/23 1627  Temp    Pulse 87 01/16/23 1629  Resp 11 01/16/23 1629  SpO2 100 % 01/16/23 1629  Vitals shown include unfiled device data.  Last Pain:  Vitals:   01/16/23 1253  TempSrc: Oral  PainSc: 0-No pain         Complications: No notable events documented.

## 2023-01-16 NOTE — Op Note (Signed)
01/16/2023  4:20 PM  PATIENT:  Danny Novak  32 y.o. male  PRE-OPERATIVE DIAGNOSIS: 1.  Right ankle bimalleolar fracture 2.  Right ankle syndesmosis disruption  POST-OPERATIVE DIAGNOSIS: Same  Procedure(s): 1.  Open treatment of right ankle bimalleolar fracture with internal fixation 2.  Stress examination of the right ankle under fluoroscopy 3.  Open treatment of right ankle syndesmosis disruption fixation AP, mortise and lateral radiographs of the ankle  SURGEON:  Toni Arthurs, MD  ASSISTANT:  ANESTHESIA:   General, regional  EBL:  minimal   TOURNIQUET:   Total Tourniquet Time Documented: Thigh (Right) - 69 minutes Total: Thigh (Right) - 69 minutes  COMPLICATIONS:  None apparent  DISPOSITION:  Extubated, awake and stable to recovery.  INDICATION FOR PROCEDURE: 32 year old male with past medical history significant for smoking injured his right ankle wrestling with his nephew approximately 2 weeks ago.  He sustained a proximal fibula fracture, syndesmosis disruption and posterior malleolus fracture.  He presents now for operative treatment of these displaced and unstable right ankle injuries.  The risks and benefits of the alternative treatment options have been discussed in detail.  The patient wishes to proceed with surgery and specifically understands risks of bleeding, infection, nerve damage, blood clots, need for additional surgery, amputation and death.   PROCEDURE IN DETAIL:  After pre operative consent was obtained, and the correct operative site was identified, the patient was brought to the operating room and placed supine on the OR table.  Anesthesia was administered.  Pre-operative antibiotics were administered.  A surgical timeout was taken.  The right lower extremity was prepped and draped in standard sterile fashion with a tourniquet around the thigh.  The extremity was elevated, and the tourniquet was inflated to 250 mmHg.  A posterior lateral incision was  made, and dissection was carried down through the subcutaneous tissues.  The interval between the peroneals and the FHL was then developed.  The posterior malleolus fracture was identified.  It was mobilized and reduced.  It was provisionally pinned.  Radiographs confirmed appropriate reduction of the fracture.  A 2 hole one third tubular plate from the [employer] small frag set was positioned over the apex of the fracture line.  It was secured with a bicortical screw proximally and a unicortical screw distally.  The guidepin distal to the plate was then overdrilled.  A 4 mm cannulated partially-threaded screw was inserted with a washer.  This compressed the distal fracture lined up appropriately.  Radiographs confirmed appropriate reduction of the posterior malleolus fracture and appropriate position and length of the screws.  A stress examination was then performed.  Mortise view was obtained.  Under live fluoroscopy dorsiflexion and external rotation stress was applied to the supinated forefoot.  The syndesmosis was noted to be unstable.  Dissection was carried over the distal fibula.  A 3 hole one third tubular plate was placed adjacent to the syndesmosis and secured proximally and distally with unicortical screws.  The syndesmosis was then reduced.  A drill hole was made across all 4 cortices of the distal tibia and fibula.  A zip tight suture button device was passed through the medial cortex of the tibia.  It was toggled appropriately.  It was then tightened securely.  AP, mortise and lateral radiographs confirmed appropriate reduction of the posterior malleolus fracture and the syndesmosis.  The wound was irrigated copiously and sprinkled with vancomycin powder.  Subcutaneous tissues were approximated with Vicryl.  Skin incision were closed with nylon.  Sterile dressings were applied followed by a well-padded short leg splint.  The tourniquet was released after application of the dressings.   Patient was awakened from anesthesia and transported to the recovery room in stable condition.  FOLLOW UP PLAN: Nonweightbearing on the right lower extremity.  Follow-up in the office in 2 weeks for suture removal and conversion to a short leg cast.  Plan 6 weeks postoperative nonweightbearing immobilization.  Xarelto for DVT prophylaxis due to his history of smoking.   RADIOGRAPHS: AP, mortise and lateral radiographs of the right ankle are obtained intraoperatively.  These show interval reduction and fixation of the posterior malleolus and the syndesmosis.  Hardware is appropriately positioned and of the appropriate lengths.  No other acute injuries are noted.

## 2023-01-20 ENCOUNTER — Encounter (HOSPITAL_BASED_OUTPATIENT_CLINIC_OR_DEPARTMENT_OTHER): Payer: Self-pay | Admitting: Orthopedic Surgery
